# Patient Record
Sex: Male | Born: 1985 | Hispanic: No | Marital: Married | Smoking: Never smoker
Health system: Southern US, Community
[De-identification: ages and names within clinical notes are randomized; demographics above are authoritative.]

## PROBLEM LIST (undated history)

## (undated) DIAGNOSIS — J45909 Unspecified asthma, uncomplicated: Secondary | ICD-10-CM

## (undated) DIAGNOSIS — E7800 Pure hypercholesterolemia, unspecified: Secondary | ICD-10-CM

## (undated) DIAGNOSIS — E119 Type 2 diabetes mellitus without complications: Secondary | ICD-10-CM

---

## 2015-04-03 ENCOUNTER — Emergency Department
Admission: EM | Admit: 2015-04-03 | Discharge: 2015-04-03 | Disposition: A | Discharge status: Home / Self Care | Payer: Self-pay | Attending: Emergency Medicine | Admitting: Emergency Medicine

## 2015-04-03 ENCOUNTER — Emergency Department: Payer: Self-pay

## 2015-04-03 DIAGNOSIS — K760 Fatty (change of) liver, not elsewhere classified: Secondary | ICD-10-CM | POA: Insufficient documentation

## 2015-04-03 DIAGNOSIS — N23 Unspecified renal colic: Secondary | ICD-10-CM | POA: Insufficient documentation

## 2015-04-03 DIAGNOSIS — R739 Hyperglycemia, unspecified: Secondary | ICD-10-CM | POA: Insufficient documentation

## 2015-04-03 LAB — CBC WITH DIFFERENTIAL/PLATELET
BASOS ABS: 0 10*3/uL (ref 0–0.1)
BASOS PCT: 1 %
Eosinophils Absolute: 0.3 10*3/uL (ref 0–0.7)
Eosinophils Relative: 3 %
HEMATOCRIT: 42 % (ref 40.0–52.0)
HEMOGLOBIN: 14.3 g/dL (ref 13.0–18.0)
Lymphocytes Relative: 33 %
Lymphs Abs: 3 10*3/uL (ref 1.0–3.6)
MCH: 27 pg (ref 26.0–34.0)
MCHC: 34.1 g/dL (ref 32.0–36.0)
MCV: 79.1 fL — ABNORMAL LOW (ref 80.0–100.0)
Monocytes Absolute: 0.6 10*3/uL (ref 0.2–1.0)
Monocytes Relative: 7 %
NEUTROS ABS: 5.2 10*3/uL (ref 1.4–6.5)
NEUTROS PCT: 56 %
Platelets: 173 10*3/uL (ref 150–440)
RBC: 5.31 MIL/uL (ref 4.40–5.90)
RDW: 13.3 % (ref 11.5–14.5)
WBC: 9.1 10*3/uL (ref 3.8–10.6)

## 2015-04-03 LAB — BASIC METABOLIC PANEL
ANION GAP: 9 (ref 5–15)
BUN: 13 mg/dL (ref 6–20)
CALCIUM: 8.7 mg/dL — AB (ref 8.9–10.3)
CO2: 25 mmol/L (ref 22–32)
Chloride: 97 mmol/L — ABNORMAL LOW (ref 101–111)
Creatinine, Ser: 0.96 mg/dL (ref 0.61–1.24)
Glucose, Bld: 265 mg/dL — ABNORMAL HIGH (ref 65–99)
Potassium: 3.6 mmol/L (ref 3.5–5.1)
Sodium: 131 mmol/L — ABNORMAL LOW (ref 135–145)

## 2015-04-03 MED ORDER — HYDROMORPHONE HCL 1 MG/ML IJ SOLN
INTRAMUSCULAR | Status: AC
  Administered 2015-04-03: 1 mg via INTRAVENOUS
  Filled 2015-04-03: qty 1

## 2015-04-03 MED ORDER — KETOROLAC TROMETHAMINE 30 MG/ML IJ SOLN
INTRAMUSCULAR | Status: AC
  Administered 2015-04-03: 30 mg via INTRAMUSCULAR
  Filled 2015-04-03: qty 1

## 2015-04-03 MED ORDER — SODIUM CHLORIDE 0.9 % IV BOLUS (SEPSIS)
1000.0000 mL | Freq: Once | INTRAVENOUS | Status: AC
  Administered 2015-04-03: 1000 mL via INTRAVENOUS

## 2015-04-03 MED ORDER — ONDANSETRON HCL 4 MG/2ML IJ SOLN
INTRAMUSCULAR | Status: AC
  Filled 2015-04-03: qty 2

## 2015-04-03 MED ORDER — ONDANSETRON 8 MG PO TBDP: 8.0000 mg | ORAL_TABLET | Freq: Three times a day (TID) | ORAL | Status: AC | PRN

## 2015-04-03 MED ORDER — HYDROMORPHONE HCL 1 MG/ML IJ SOLN
INTRAMUSCULAR | Status: AC
  Filled 2015-04-03: qty 1

## 2015-04-03 MED ORDER — KETOROLAC TROMETHAMINE 30 MG/ML IJ SOLN
30.0000 mg | Freq: Once | INTRAMUSCULAR | Status: AC
  Administered 2015-04-03: 30 mg via INTRAMUSCULAR

## 2015-04-03 MED ORDER — ONDANSETRON HCL 4 MG/2ML IJ SOLN
4.0000 mg | Freq: Once | INTRAMUSCULAR | Status: AC
  Administered 2015-04-03: 4 mg via INTRAVENOUS

## 2015-04-03 MED ORDER — HYDROMORPHONE HCL 1 MG/ML IJ SOLN
1.0000 mg | Freq: Once | INTRAMUSCULAR | Status: AC
  Administered 2015-04-03: 1 mg via INTRAVENOUS

## 2015-04-03 MED ORDER — OXYCODONE-ACETAMINOPHEN 5-325 MG PO TABS: 1.0000 | ORAL_TABLET | Freq: Four times a day (QID) | ORAL | Status: DC | PRN

## 2015-04-03 MED ORDER — NAPROXEN 500 MG PO TABS: 500.0000 mg | ORAL_TABLET | Freq: Two times a day (BID) | ORAL | Status: AC

## 2015-04-03 NOTE — ED Notes (Signed)
Pt being discharged, MD made aware of patient elevated  blood pressure, states okay for patient to still be discharged.

## 2015-04-03 NOTE — Discharge Instructions (Signed)
You were prescribed a medication that is potentially sedating. Do not drink alcohol, drive or participate in any other potentially dangerous activities while taking this medication as it may make you sleepy. Do not take this medication with any other sedating medications, either prescription or over-the-counter. If you were prescribed Percocet or Vicodin, do not take these with acetaminophen (Tylenol) as it is already contained within these medications.   Opioid pain medications (or "narcotics") can be habit forming.  Use it as little as possible to achieve adequate pain control.  Do not use or use it with extreme caution if you have a history of opiate abuse or dependence.  If you are on a pain contract with your primary care doctor or a pain specialist, be sure to let them know you were prescribed this medication today from the [facility].  This medication is intended for your use only - do not give any to anyone else and keep it in a secure place where nobody else, especially children and pets, have access to it.  It will also cause or worsen constipation, so you may want to consider taking an over-the-counter stool softener while you are taking this medication.  Fatty Liver Fatty liver, also called hepatic steatosis or steatohepatitis, is a condition in which too much fat has built up in your liver cells. The liver removes harmful substances from your bloodstream. It produces fluids your body needs. It also helps your body use and store energy from the food you eat. In many cases, fatty liver does not cause symptoms or problems. It is often diagnosed when tests are being done for other reasons. However, over time, fatty liver can cause inflammation that may lead to more serious liver problems, such as scarring of the liver (cirrhosis). CAUSES  Causes of fatty liver may include:   Drinking too much alcohol.  Poor nutrition.  Obesity.  Cushing  syndrome.  Diabetes.  Hyperlipidemia.  Pregnancy.  Certain drugs.  Poisons.  Some viral infections. RISK FACTORS You may be more likely to develop fatty liver if you:  Abuse alcohol.  Are pregnant.  Are overweight.  Have diabetes.  Have hepatitis.  Have a high triglyceride level.  SIGNS AND SYMPTOMS  Fatty liver often does not cause any symptoms. In cases where symptoms develop, they can include:  Fatigue.  Weakness.  Weight loss.  Confusion.   Abdominal pain.  Yellowing of your skin and the white parts of your eyes (jaundice).  Nausea and vomiting. DIAGNOSIS  Fatty liver may be diagnosed by:   Physical exam and medical history.  Blood tests.  Imaging tests, such as an ultrasound, CT scan, or MRI.  Liver biopsy. A small sample of liver tissue is removed using a needle. The sample is then looked at under a microscope. TREATMENT  Fatty liver is often caused by other health conditions. Treatment for fatty liver may involve medicines and lifestyle changes to manage conditions such as:   Alcoholism.  High cholesterol.  Diabetes.  Being overweight or obese.  HOME CARE INSTRUCTIONS  Eat a healthy diet as directed by your health care provider.  Exercise regularly. This can help you lose weight and control your cholesterol and diabetes. Talk to your health care provider about an exercise plan and which activities are best for you.  Do not drink alcohol.   Take medicines only as directed by your health care provider. SEEK MEDICAL CARE IF: You have difficulty controlling your:  Blood sugar.  Cholesterol.  Alcohol consumption. SEEK IMMEDIATE MEDICAL CARE IF:  You have abdominal pain.  You have jaundice.  You have nausea and vomiting.   This information is not intended to replace advice given to you by your health care provider. Make sure you discuss any questions you have with your health care provider.   Document Released: 07/21/2005  Document Revised: 06/26/2014 Document Reviewed: 10/15/2013 Elsevier Interactive Patient Education 2016 [employer]  Hyperglycemia Hyperglycemia occurs when the glucose (sugar) in your blood is too high. Hyperglycemia can happen for many reasons, but it most often happens to people who do not know they have diabetes or are not managing their diabetes properly.  CAUSES  Whether you have diabetes or not, there are other causes of hyperglycemia. Hyperglycemia can occur when you have diabetes, but it can also occur in other situations that you might not be as aware of, such as: Diabetes  If you have diabetes and are having problems controlling your blood glucose, hyperglycemia could occur because of some of the following reasons:  Not following your meal plan.  Not taking your diabetes medications or not taking it properly.  Exercising less or doing less activity than you normally do.  Being sick. Pre-diabetes  This cannot be ignored. Before people develop Type 2 diabetes, they almost always have "pre-diabetes." This is when your blood glucose levels are higher than normal, but not yet high enough to be diagnosed as diabetes. Research has shown that some long-term damage to the body, especially the heart and circulatory system, may already be occurring during pre-diabetes. If you take action to manage your blood glucose when you have pre-diabetes, you may delay or prevent Type 2 diabetes from developing. Stress  If you have diabetes, you may be "diet" controlled or on oral medications or insulin to control your diabetes. However, you may find that your blood glucose is higher than usual in the hospital whether you have diabetes or not. This is often referred to as "stress hyperglycemia." Stress can elevate your blood glucose. This happens because of hormones put out by the body during times of stress. If stress has been the cause of your high blood glucose, it can be followed regularly by your  caregiver. That way he/she can make sure your hyperglycemia does not continue to get worse or progress to diabetes. Steroids  Steroids are medications that act on the infection fighting system (immune system) to block inflammation or infection. One side effect can be a rise in blood glucose. Most people can produce enough extra insulin to allow for this rise, but for those who cannot, steroids make blood glucose levels go even higher. It is not unusual for steroid treatments to "uncover" diabetes that is developing. It is not always possible to determine if the hyperglycemia will go away after the steroids are stopped. A special blood test called an A1c is sometimes done to determine if your blood glucose was elevated before the steroids were started. SYMPTOMS  Thirsty.  Frequent urination.  Dry mouth.  Blurred vision.  Tired or fatigue.  Weakness.  Sleepy.  Tingling in feet or leg. DIAGNOSIS  Diagnosis is made by monitoring blood glucose in one or all of the following ways:  A1c test. This is a chemical found in your blood.  Fingerstick blood glucose monitoring.  Laboratory results. TREATMENT  First, knowing the cause of the hyperglycemia is important before the hyperglycemia can be treated. Treatment may include, but is not be limited to:  Education.  Change or  adjustment in medications.  Change or adjustment in meal plan.  Treatment for an illness, infection, etc.  More frequent blood glucose monitoring.  Change in exercise plan.  Decreasing or stopping steroids.  Lifestyle changes. HOME CARE INSTRUCTIONS   Test your blood glucose as directed.  Exercise regularly. Your caregiver will give you instructions about exercise. Pre-diabetes or diabetes which comes on with stress is helped by exercising.  Eat wholesome, balanced meals. Eat often and at regular, fixed times. Your caregiver or nutritionist will give you a meal plan to guide your sugar intake.  Being at  an ideal weight is important. If needed, losing as little as 10 to 15 pounds may help improve blood glucose levels. SEEK MEDICAL CARE IF:   You have questions about medicine, activity, or diet.  You continue to have symptoms (problems such as increased thirst, urination, or weight gain). SEEK IMMEDIATE MEDICAL CARE IF:   You are vomiting or have diarrhea.  Your breath smells fruity.  You are breathing faster or slower.  You are very sleepy or incoherent.  You have numbness, tingling, or pain in your feet or hands.  You have chest pain.  Your symptoms get worse even though you have been following your caregiver's orders.  If you have any other questions or concerns.   This information is not intended to replace advice given to you by your health care provider. Make sure you discuss any questions you have with your health care provider.   Document Released: 11/29/2000 Document Revised: 08/28/2011 Document Reviewed: 02/09/2015 Elsevier Interactive Patient Education 2016 Else[employer] Inc[employer]  Kidney Stones Kidney stones (urolithiasis) are deposits that form inside your kidneys. The intense pain is caused by the stone moving through the urinary tract. When the stone moves, the ureter goes into spasm around the stone. The stone is usually passed in the urine.  CAUSES   A disorder that makes certain neck glands produce too much parathyroid hormone (primary hyperparathyroidism).  A buildup of uric acid crystals, similar to gout in your joints.  Narrowing (stricture) of the ureter.  A kidney obstruction present at birth (congenital obstruction).  Previous surgery on the kidney or ureters.  Numerous kidney infections. SYMPTOMS   Feeling sick to your stomach (nauseous).  Throwing up (vomiting).  Blood in the urine (hematuria).  Pain that usually spreads (radiates) to the groin.  Frequency or urgency of urination. DIAGNOSIS   Taking a history and physical exam.  Blood or  urine tests.  CT scan.  Occasionally, an examination of the inside of the urinary bladder (cystoscopy) is performed. TREATMENT   Observation.  Increasing your fluid intake.  Extracorporeal shock wave lithotripsy--This is a noninvasive procedure that uses shock waves to break up kidney stones.  Surgery may be needed if you have severe pain or persistent obstruction. There are various surgical procedures. Most of the procedures are performed with the use of small instruments. Only small incisions are needed to accommodate these instruments, so recovery time is minimized. The size, location, and chemical composition are all important variables that will determine the proper choice of action for you. Talk to your health care provider to better understand your situation so that you will minimize the risk of injury to yourself and your kidney.  HOME CARE INSTRUCTIONS   Drink enough water and fluids to keep your urine clear or pale yellow. This will help you to pass the stone or stone fragments.  Strain all urine through the provided strainer. Keep all particulate matter  and stones for your health care provider to see. The stone causing the pain may be as small as a grain of salt. It is very important to use the strainer each and every time you pass your urine. The collection of your stone will allow your health care provider to analyze it and verify that a stone has actually passed. The stone analysis will often identify what you can do to reduce the incidence of recurrences.  Only take over-the-counter or prescription medicines for pain, discomfort, or fever as directed by your health care provider.  Keep all follow-up visits as told by your health care provider. This is important.  Get follow-up X-rays if required. The absence of pain does not always mean that the stone has passed. It may have only stopped moving. If the urine remains completely obstructed, it can cause loss of kidney function  or even complete destruction of the kidney. It is your responsibility to make sure X-rays and follow-ups are completed. Ultrasounds of the kidney can show blockages and the status of the kidney. Ultrasounds are not associated with any radiation and can be performed easily in a matter of minutes.  Make changes to your daily diet as told by your health care provider. You may be told to:  Limit the amount of salt that you eat.  Eat 5 or more servings of fruits and vegetables each day.  Limit the amount of meat, poultry, fish, and eggs that you eat.  Collect a 24-hour urine sample as told by your health care provider.You may need to collect another urine sample every 6-12 months. SEEK MEDICAL CARE IF:  You experience pain that is progressive and unresponsive to any pain medicine you have been prescribed. SEEK IMMEDIATE MEDICAL CARE IF:   Pain cannot be controlled with the prescribed medicine.  You have a fever or shaking chills.  The severity or intensity of pain increases over 18 hours and is not relieved by pain medicine.  You develop a new onset of abdominal pain.  You feel faint or pass out.  You are unable to urinate.   This information is not intended to replace advice given to you by your health care provider. Make sure you discuss any questions you have with your health care provider.   Document Released: 06/05/2005 Document Revised: 02/24/2015 Document Reviewed: 11/06/2012 Elsevier Interactive Patient Education [employer]

## 2015-04-03 NOTE — ED Notes (Signed)
Pt here with c/o sudden onset right flank pain,   Pain is constant and increases in intensity at times,  +nausea and vomited x 1 en route.  Denies urinary symptoms,  States he has not voided since this morning,   No hx of kidney stones

## 2015-04-03 NOTE — ED Notes (Signed)
Patient transported to CT 

## 2015-04-03 NOTE — ED Notes (Signed)
Pt requesting pain medication prior to discharge, reports pain is increasing. MD notified, see MAR for order.

## 2015-04-03 NOTE — ED Provider Notes (Signed)
[facility] Emergency Department Provider Note  ____________________________________________  Time seen: 6:45 PM  I have reviewed the triage vital signs and the nursing notes.   HISTORY  Chief Complaint Flank Pain    HPI William Kidd is a 29 y.o. male who complains of severe sudden onset of right flank pain one hour ago. It's constant but waxing and waning. Associated with nausea and vomiting times one. No chest pain shortness breath fevers or chills. Decreased urination today but otherwise no urinary symptoms recently. No medical problems. Does not take any medications no surgeries. Does not smoke or drink.     History reviewed. No pertinent past medical history.   There are no active problems to display for this patient.    History reviewed. No pertinent past surgical history.   Current Outpatient Rx  Name  Route  Sig  Dispense  Refill  . naproxen (NAPROSYN) 500 MG tablet   Oral   Take 1 tablet (500 mg total) by mouth 2 (two) times daily with a meal.   20 tablet   0   . ondansetron (ZOFRAN ODT) 8 MG disintegrating tablet   Oral   Take 1 tablet (8 mg total) by mouth every 8 (eight) hours as needed for nausea or vomiting.   20 tablet   0   . oxyCODONE-acetaminophen (ROXICET) 5-325 MG tablet   Oral   Take 1 tablet by mouth every 6 (six) hours as needed for severe pain.   8 tablet   0      Allergies Review of patient's allergies indicates no known allergies.   No family history on file.  Social History Social History  Substance Use Topics  . Smoking status: Never Smoker   . Smokeless tobacco: None  . Alcohol Use: None    Review of Systems  Constitutional:   No fever or chills. No weight changes Eyes:   No blurry vision or double vision.  ENT:   No sore throat. Cardiovascular:   No chest pain. Respiratory:   No dyspnea or cough. Gastrointestinal:   Negative for abdominal pain, vomiting and diarrhea.  No  BRBPR or melena. Genitourinary:   Negative for dysuria, urinary retention, bloody urine, or difficulty urinating. Musculoskeletal: positive right flank pain No joint swelling or pain. Skin:   Negative for rash. Neurological:   Negative for headaches, focal weakness or numbness. Psychiatric:  No anxiety or depression.   Endocrine:  No hot/cold intolerance, changes in energy, or sleep difficulty.  10-point ROS otherwise negative.  ____________________________________________   PHYSICAL EXAM:  VITAL SIGNS: ED Triage Vitals  Enc Vitals Group     BP 04/03/15 1900 157/109 mmHg     Pulse Rate 04/03/15 1900 69     Resp 04/03/15 1950 26     Temp 04/03/15 1950 98.5 F (36.9 C)     Temp Source 04/03/15 1950 Oral     SpO2 04/03/15 1900 99 %     Weight --      Height --      Head Cir --      Peak Flow --      Pain Score 04/03/15 1950 9     Pain Loc --      Pain Edu? --      Excl. in GC? --      Constitutional:   Alert and oriented. Mild distress due to pain. Eyes:   No scleral icterus. No conjunctival pallor. PERRL. EOMI ENT   Head:  Normocephalic and atraumatic.   Nose:   No congestion/rhinnorhea. No septal hematoma   Mouth/Throat:   MMM, no pharyngeal erythema. No peritonsillar mass. No uvula shift.   Neck:   No stridor. No SubQ emphysema. No meningismus. Hematological/Lymphatic/Immunilogical:   No cervical lymphadenopathy. Cardiovascular:   RRR. Normal and symmetric distal pulses are present in all extremities. No murmurs, rubs, or gallops. Respiratory:   Normal respiratory effort without tachypnea nor retractions. Breath sounds are clear and equal bilaterally. No wheezes/rales/rhonchi. Gastrointestinal:   Soft and nontender. No distention. There is no CVA tenderness.  No rebound, rigidity, or guarding. Genitourinary:   deferred Musculoskeletal:   Nontender with normal range of motion in all extremities. No joint effusions.  No lower extremity tenderness.  No  edema. Neurologic:   Normal speech and language.  CN 2-10 normal. Motor grossly intact. No pronator drift.  Normal gait. No gross focal neurologic deficits are appreciated.  Skin:    Skin is warm, dry and intact. No rash noted.  No petechiae, purpura, or bullae. Psychiatric:   Mood and affect are normal. Speech and behavior are normal. Patient exhibits appropriate insight and judgment.  ____________________________________________    LABS (pertinent positives/negatives) (all labs ordered are listed, but only abnormal results are displayed) Labs Reviewed  BASIC METABOLIC PANEL - Abnormal; Notable for the following:    Sodium 131 (*)    Chloride 97 (*)    Glucose, Bld 265 (*)    Calcium 8.7 (*)    All other components within normal limits  CBC WITH DIFFERENTIAL/PLATELET - Abnormal; Notable for the following:    MCV 79.1 (*)    All other components within normal limits   ____________________________________________   EKG    ____________________________________________    RADIOLOGY  CT abdomen and pelvis reveals 1 mm ureterolithiasis at the right UVJ without obstruction  ____________________________________________   PROCEDURES   ____________________________________________   INITIAL IMPRESSION / ASSESSMENT AND PLAN / ED COURSE  Pertinent labs & imaging results that were available during my care of the patient were reviewed by me and considered in my medical decision making (see chart for details).  Patient presents with renal colic. CT finds a 1 mm stone that is already migrated to the UVJ. Given the very short duration of the symptoms and the tiny stone that is already at the UVJ, without evidence of obstruction, there is almost no chance that he could develop an infection or complication from this. We'll have him follow up with primary care. I counseled the patient on his high blood sugar as well as the hepatic steatosis of that he can follow up for further  evaluation and management of any chronic conditions. We have the patient NSAIDs Percocet and Zofran for symptomatic relief until the stone passes.     ____________________________________________   FINAL CLINICAL IMPRESSION(S) / ED DIAGNOSES  Final diagnoses:  Renal colic on right side  Hyperglycemia  Hepatic steatosis      Sharman Cheek, MD 04/03/15 2046

## 2016-01-01 ENCOUNTER — Encounter: Payer: Self-pay | Admitting: Emergency Medicine

## 2016-01-01 ENCOUNTER — Other Ambulatory Visit: Payer: Self-pay

## 2016-01-01 ENCOUNTER — Emergency Department: Payer: BLUE CROSS/BLUE SHIELD

## 2016-01-01 DIAGNOSIS — E119 Type 2 diabetes mellitus without complications: Secondary | ICD-10-CM | POA: Diagnosis not present

## 2016-01-01 DIAGNOSIS — F410 Panic disorder [episodic paroxysmal anxiety] without agoraphobia: Secondary | ICD-10-CM | POA: Diagnosis not present

## 2016-01-01 DIAGNOSIS — J45909 Unspecified asthma, uncomplicated: Secondary | ICD-10-CM | POA: Diagnosis not present

## 2016-01-01 DIAGNOSIS — R0600 Dyspnea, unspecified: Secondary | ICD-10-CM | POA: Diagnosis present

## 2016-01-01 DIAGNOSIS — Z79899 Other long term (current) drug therapy: Secondary | ICD-10-CM | POA: Diagnosis not present

## 2016-01-01 DIAGNOSIS — Z7984 Long term (current) use of oral hypoglycemic drugs: Secondary | ICD-10-CM | POA: Insufficient documentation

## 2016-01-01 LAB — CBC
HCT: 42.1 % (ref 40.0–52.0)
Hemoglobin: 14.5 g/dL (ref 13.0–18.0)
MCH: 27.6 pg (ref 26.0–34.0)
MCHC: 34.5 g/dL (ref 32.0–36.0)
MCV: 79.9 fL — ABNORMAL LOW (ref 80.0–100.0)
PLATELETS: 191 10*3/uL (ref 150–440)
RBC: 5.26 MIL/uL (ref 4.40–5.90)
RDW: 13.4 % (ref 11.5–14.5)
WBC: 7.9 10*3/uL (ref 3.8–10.6)

## 2016-01-01 NOTE — ED Notes (Addendum)
Pt c/o intermittent pain to his upper back for about a year; now feels like he's having trouble breathing and short of breath; history of asthma but not using at this time; pt concerned he's having a heart attack; denies chest pain; pt talking in complete coherent sentences but occasionally, when not talking, takes in a large deep breath

## 2016-01-02 ENCOUNTER — Emergency Department
Admission: EM | Admit: 2016-01-02 | Discharge: 2016-01-02 | Disposition: A | Discharge status: Home / Self Care | Payer: BLUE CROSS/BLUE SHIELD | Attending: Emergency Medicine | Admitting: Emergency Medicine

## 2016-01-02 DIAGNOSIS — F410 Panic disorder [episodic paroxysmal anxiety] without agoraphobia: Secondary | ICD-10-CM

## 2016-01-02 HISTORY — DX: Type 2 diabetes mellitus without complications: E11.9

## 2016-01-02 HISTORY — DX: Unspecified asthma, uncomplicated: J45.909

## 2016-01-02 HISTORY — DX: Pure hypercholesterolemia, unspecified: E78.00

## 2016-01-02 LAB — FIBRIN DERIVATIVES D-DIMER (ARMC ONLY): Fibrin derivatives D-dimer (ARMC): 101 (ref 0–499)

## 2016-01-02 LAB — BASIC METABOLIC PANEL
Anion gap: 9 (ref 5–15)
BUN: 15 mg/dL (ref 6–20)
CALCIUM: 9.3 mg/dL (ref 8.9–10.3)
CO2: 27 mmol/L (ref 22–32)
CREATININE: 0.9 mg/dL (ref 0.61–1.24)
Chloride: 99 mmol/L — ABNORMAL LOW (ref 101–111)
GFR calc Af Amer: >60 mL/min (ref >60)
GLUCOSE: 154 mg/dL — AB (ref 65–99)
POTASSIUM: 3.5 mmol/L (ref 3.5–5.1)
Sodium: 135 mmol/L (ref 135–145)

## 2016-01-02 LAB — TROPONIN I
Troponin I: <0.03 ng/mL (ref <0.03)
Troponin I: <0.03 ng/mL (ref <0.03)

## 2016-01-02 MED ORDER — LORAZEPAM 1 MG PO TABS
1.0000 mg | ORAL_TABLET | Freq: Once | ORAL | Status: AC
  Administered 2016-01-02: 1 mg via ORAL
  Filled 2016-01-02: qty 1

## 2016-01-02 NOTE — Discharge Instructions (Signed)

## 2016-01-02 NOTE — ED Provider Notes (Signed)
[facility] Emergency Department Provider Note  ____________________________________________  Time seen: 3:30 AM  I have reviewed the triage vital signs and the nursing notes.   HISTORY  Chief Complaint Shortness of Breath and Back Pain      HPI [name] is a 30 y.o. male resents with history of intermittent episodes of dyspnea 5 years. Patient states that he gets "worked up and was breath gets worse". Patient states that he's been seen for this in the past and a left this was secondary to a panic attack for which she received a medication that helped "tremendously". Patient denies any chest pain no nausea no vomiting no diaphoresis. Patient sibling at bedside states that the patient Google his symptoms and then becomes very anxious. Patient denies any leg pain swelling. Patient denies any history of DVT or PE.    Past Medical History  Diagnosis Date  . Diabetes mellitus without complication (HCC)   . Asthma   . High cholesterol     There are no active problems to display for this patient.   History reviewed. No pertinent past surgical history.  Current Outpatient Rx  Name  Route  Sig  Dispense  Refill  . fenofibrate 54 MG tablet   Oral   Take 54 mg by mouth daily.         Marland Kitchen lisinopril (PRINIVIL,ZESTRIL) 5 MG tablet   Oral   Take 5 mg by mouth daily.         . metFORMIN (GLUCOPHAGE) 500 MG tablet   Oral   Take by mouth 2 (two) times daily with a meal.         . naproxen (NAPROSYN) 500 MG tablet   Oral   Take 1 tablet (500 mg total) by mouth 2 (two) times daily with a meal.   20 tablet   0   . ondansetron (ZOFRAN ODT) 8 MG disintegrating tablet   Oral   Take 1 tablet (8 mg total) by mouth every 8 (eight) hours as needed for nausea or vomiting.   20 tablet   0   . oxyCODONE-acetaminophen (ROXICET) 5-325 MG tablet   Oral   Take 1 tablet by mouth every 6 (six) hours as needed for severe pain.   8 tablet   0      Allergies No known drug allergies History reviewed. No pertinent family history.  Social History Social History  Substance Use Topics  . Smoking status: Never Smoker   . Smokeless tobacco: None  . Alcohol Use: No    Review of Systems  Constitutional: Negative for fever. Eyes: Negative for visual changes. ENT: Negative for sore throat. Cardiovascular: Negative for chest pain. Respiratory: Negative for shortness of breath. Gastrointestinal: Negative for abdominal pain, vomiting and diarrhea. Genitourinary: Negative for dysuria. Musculoskeletal: Negative for back pain. Skin: Negative for rash. Neurological: Negative for headaches, focal weakness or numbness. Psychiatric:Positive for anxiety  10-point ROS otherwise negative.  ____________________________________________   PHYSICAL EXAM:  VITAL SIGNS: ED Triage Vitals  Enc Vitals Group     BP 01/01/16 2322 140/73 mmHg     Pulse Rate 01/01/16 2322 88     Resp 01/01/16 2322 18     Temp 01/01/16 2322 98.7 F (37.1 C)     Temp Source 01/01/16 2322 Oral     SpO2 01/01/16 2322 100 %     Weight 01/01/16 2322 179 lb (81.194 kg)     Height --      Head  Cir --      Peak Flow --      Pain Score 01/01/16 2323 4     Pain Loc --      Pain Edu? --      Excl. in GC? --      Constitutional: Alert and oriented. Appears anxious Eyes: Conjunctivae are normal. PERRL. Normal extraocular movements. ENT   Head: Normocephalic and atraumatic.   Nose: No congestion/rhinnorhea.   Mouth/Throat: Mucous membranes are moist.   Neck: No stridor. Hematological/Lymphatic/Immunilogical: No cervical lymphadenopathy. Cardiovascular: Normal rate, regular rhythm. Normal and symmetric distal pulses are present in all extremities. No murmurs, rubs, or gallops. Respiratory: Normal respiratory effort without tachypnea nor retractions. Breath sounds are clear and equal bilaterally. No wheezes/rales/rhonchi. Gastrointestinal: Soft and  nontender. No distention. There is no CVA tenderness. Genitourinary: deferred Musculoskeletal: Nontender with normal range of motion in all extremities. No joint effusions.  No lower extremity tenderness nor edema. Neurologic:  Normal speech and language. No gross focal neurologic deficits are appreciated. Speech is normal.  Skin:  Skin is warm, dry and intact. No rash noted. Psychiatric: Anxious affect patient shaking his legs continuously during  evaluation. Speech and behavior are normal. Patient exhibits appropriate insight and judgment.  ____________________________________________    LABS (pertinent positives/negatives)  Labs Reviewed  BASIC METABOLIC PANEL - Abnormal; Notable for the following:    Chloride 99 (*)    Glucose, Bld 154 (*)    All other components within normal limits  CBC - Abnormal; Notable for the following:    MCV 79.9 (*)    All other components within normal limits  TROPONIN I  TROPONIN I  FIBRIN DERIVATIVES D-DIMER ([facility] ONLY)     ____________________________________________   EKG  ED ECG REPORT I, [place] N Cheralyn Oliver, the attending physician, personally viewed and interpreted this ECG.   Date: 01/02/2016  EKG Time: 11:23 PM  Rate: 75  Rhythm: Normal sinus rhythm  Axis: Normal  Intervals: Normal  ST&T Change: None  ____________________________________________    RADIOLOGY      DG Chest 2 View (Final result) Result time: 01/01/16 23:53:36   Final result by Rad Results In Interface (01/01/16 23:53:36)   Narrative:   CLINICAL DATA: 30 year old male with shortness of breath  EXAM: CHEST 2 VIEW  COMPARISON: None.  FINDINGS: The heart size and mediastinal contours are within normal limits. Both lungs are clear. The visualized skeletal structures are unremarkable.  IMPRESSION: No active cardiopulmonary disease.   Electronically Signed By: Elgie CollardArash Radparvar M.D. On: 01/01/2016 23:53          Procedures      INITIAL IMPRESSION / ASSESSMENT AND PLAN / ED COURSE  Pertinent labs & imaging results that were available during my care of the patient were reviewed by me and considered in my medical decision making (see chart for details).  EKG cardiac enzymes negative d-dimer negative. Patient clearly very anxious  ____________________________________________   FINAL CLINICAL IMPRESSION(S) / ED DIAGNOSES  Final diagnoses:  Panic attack      Darci Currentandolph N Carlinda Ohlson, MD 01/02/16 [id number]

## 2016-08-23 IMAGING — CT CT RENAL STONE PROTOCOL
3 of 4 series · 10 of 46 positions shown, 17 images · non-contrast
Comparison: None.

CLINICAL DATA: Right flank pain, vomiting 4 hr

EXAM:
CT ABDOMEN AND PELVIS WITHOUT CONTRAST
TECHNIQUE: Multidetector CT imaging of the abdomen and pelvis was performed
following the standard protocol without IV contrast.

[Series 5: lung · axial · 0.76mm/px · z∈[-100,+20]mm · 6 of 34 slices shown, 11 images]
[im 5/34  soft-tissue]
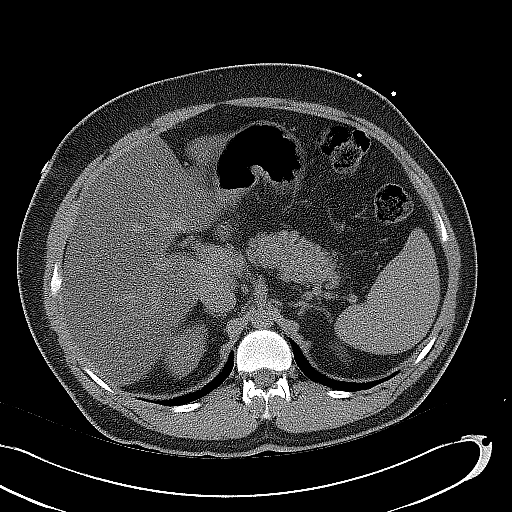
[im 5/34  bone]
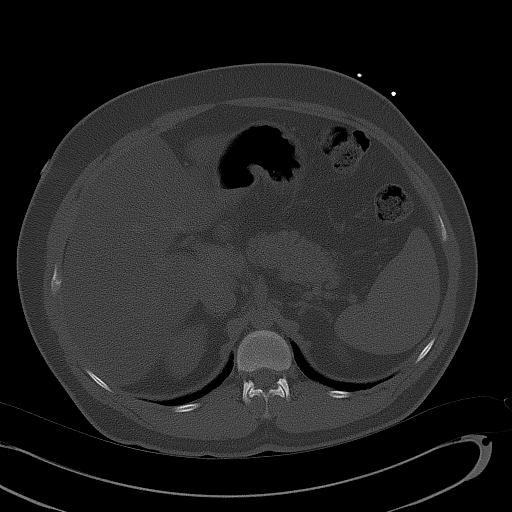
[im 10/34  soft-tissue]
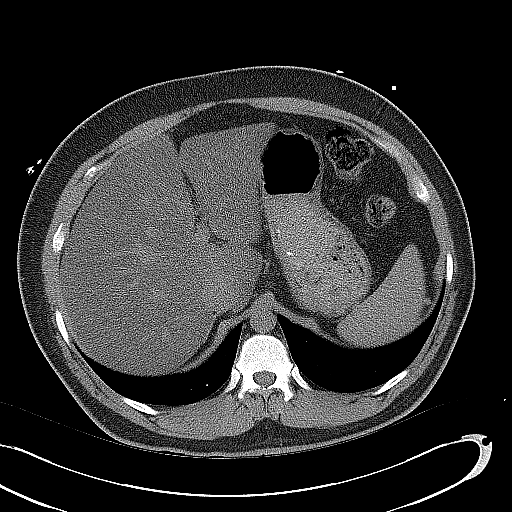
[im 15/34  soft-tissue]
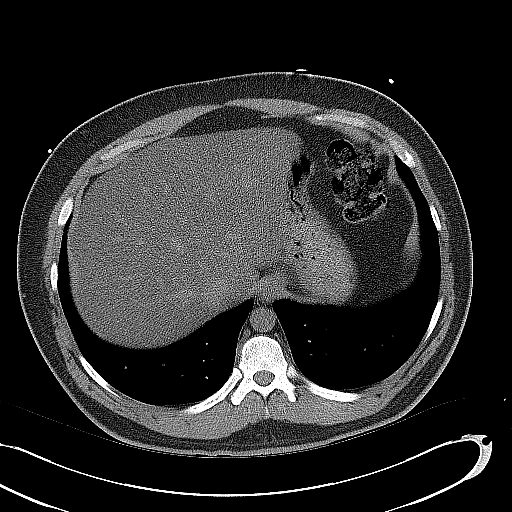
[im 15/34  lung]
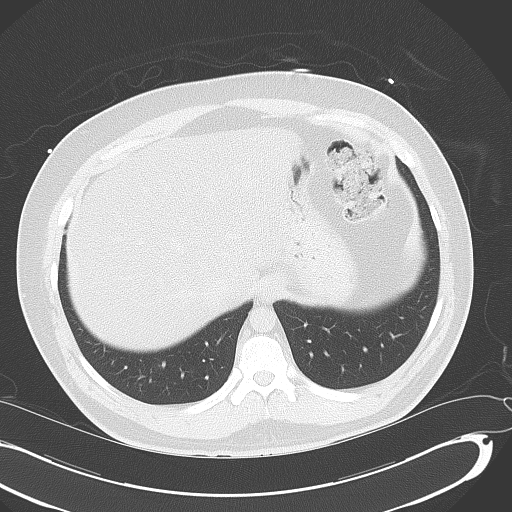
[im 19/34  soft-tissue]
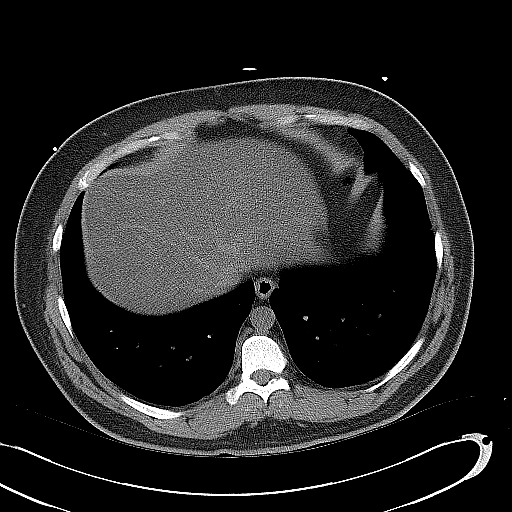
[im 19/34  lung]
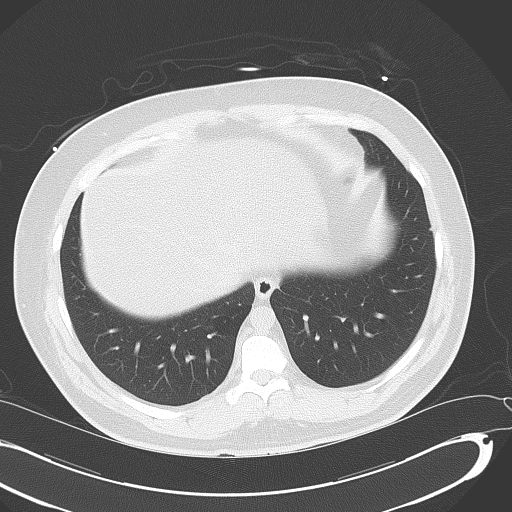
[im 24/34  soft-tissue]
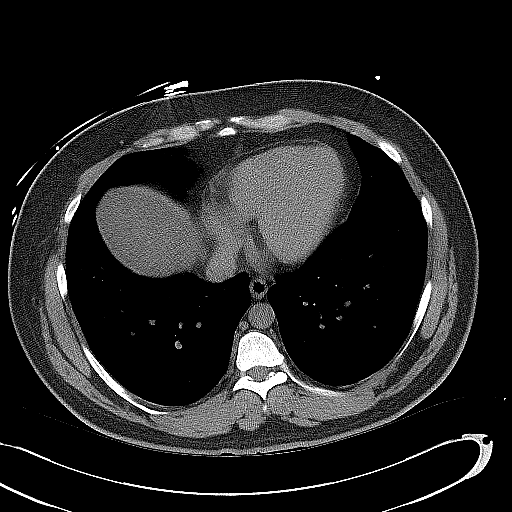
[im 24/34  lung]
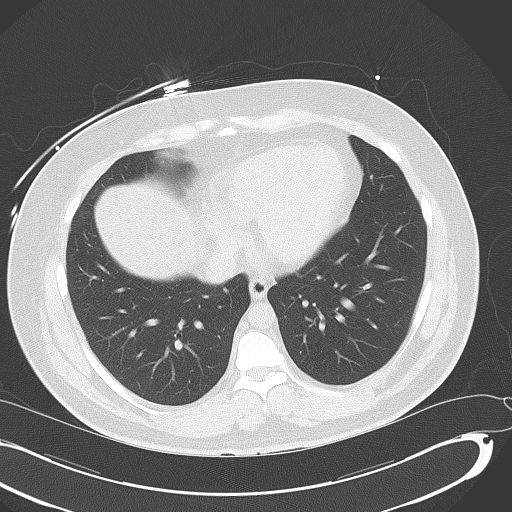
[im 29/34  soft-tissue]
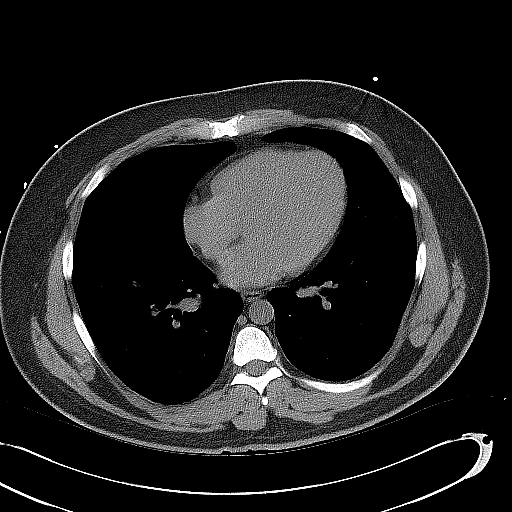
[im 29/34  lung]
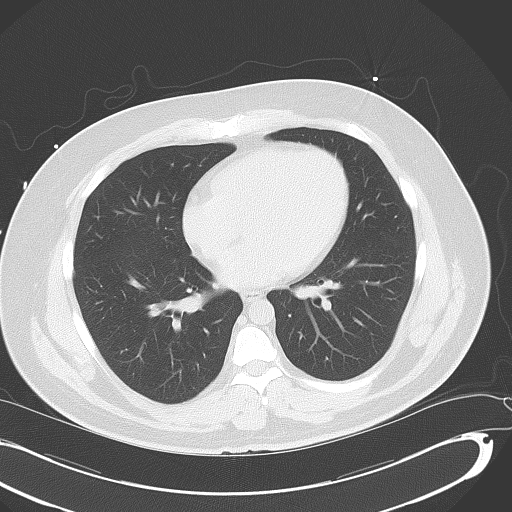

[Series 6: coronal · coronal · 0.74mm/px · 3 of 145 slices shown, 4 images]
[im 49/145  soft-tissue]
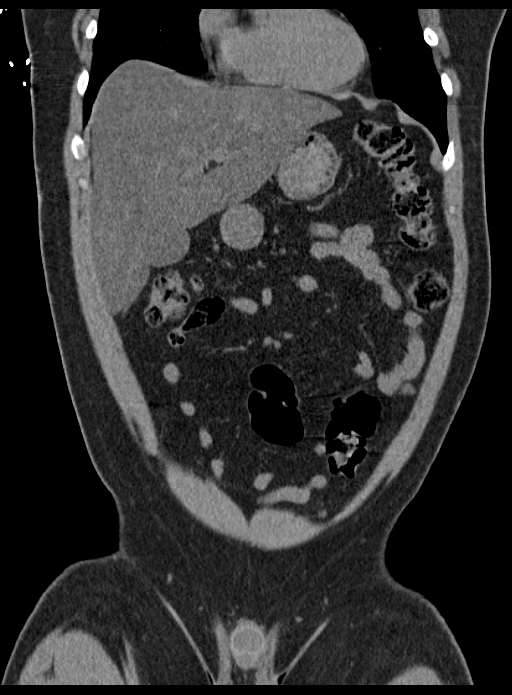
[im 65/145  soft-tissue]
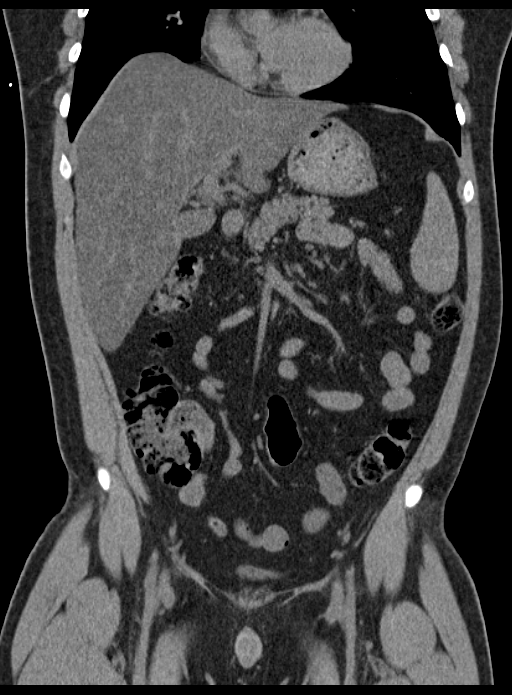
[im 65/145  bone]
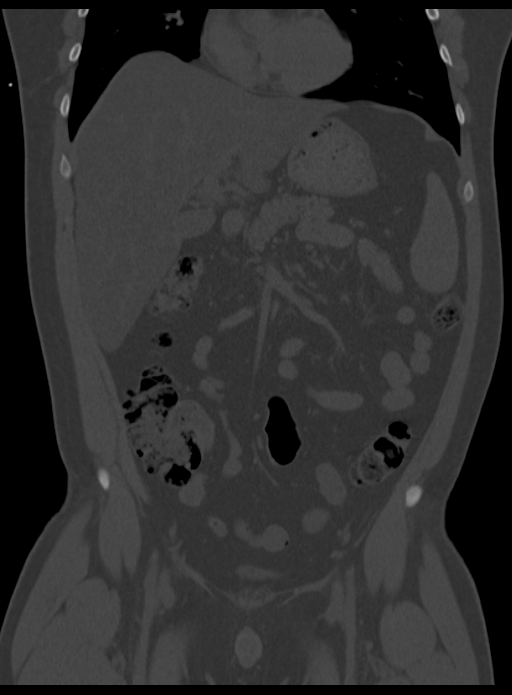
[im 81/145  soft-tissue]
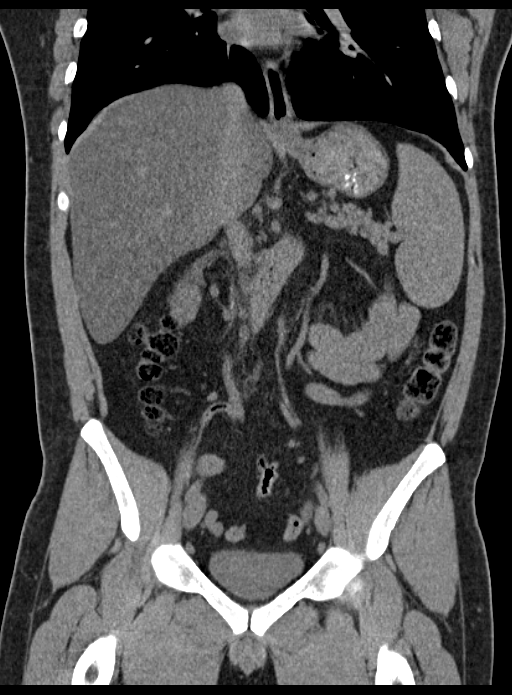

[Series 7: sagittal · sagittal · 0.63mm/px · 1 of 179 slices shown, 2 images]
[im 60/179  soft-tissue]
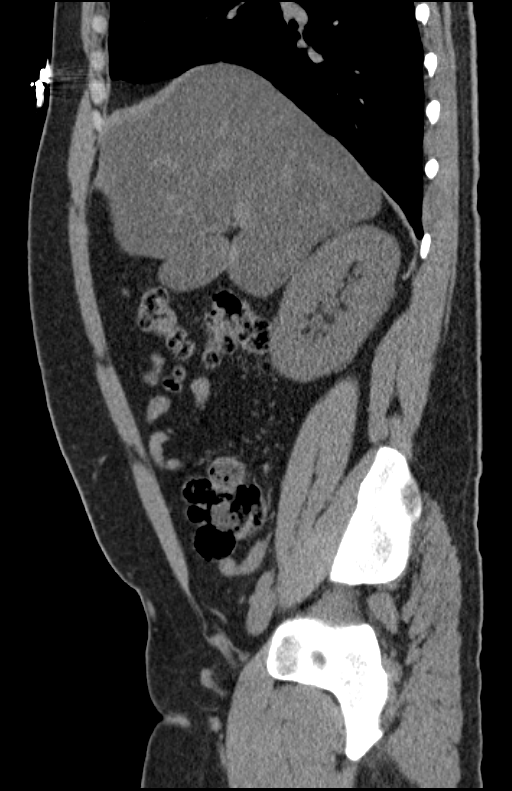
[im 60/179  bone]
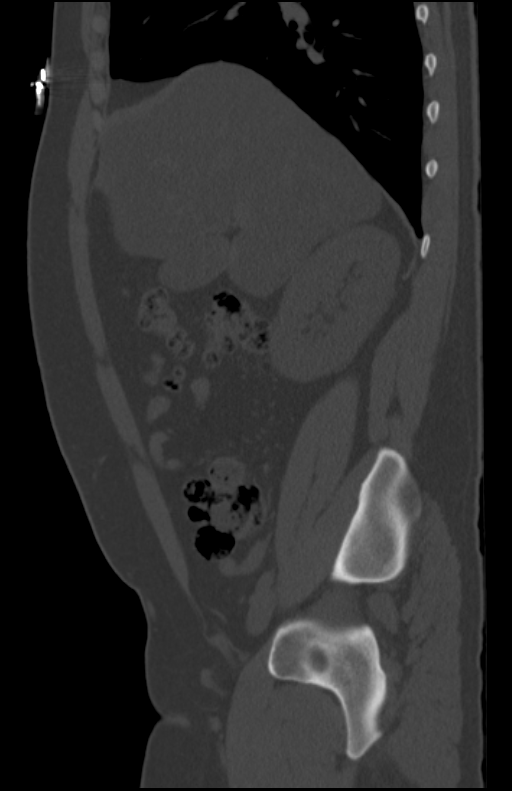

[10 of 46 positions shown; findings below may reference images not displayed]

FINDINGS: Lower chest:  Clear lung bases.  Normal heart size.

Hepatobiliary: Diffuse low attenuation of the liver consistent with
hepatic steatosis. Normal gallbladder.

Pancreas: Normal.

Spleen: Normal.

Adrenals/Urinary Tract: Normal adrenal glands. Normal left kidney.
Normal right kidney. Mild right perinephric cyst scratch them mild
right perinephric stranding. 1 mm right UVJ calculus. Minimal right
hydronephrosis. Otherwise unremarkable bladder.

Stomach/Bowel: No bowel dilatation or bowel wall thickening. No
pneumatosis, pneumoperitoneum or portal venous gas. Normal appendix.
No abdominal or pelvic free fluid.

Vascular/Lymphatic: Normal caliber abdominal aorta. No abdominal or
pelvic lymphadenopathy.

Other: No fluid collection or hematoma.

Musculoskeletal: No acute osseous abnormality. No aggressive lytic
or sclerotic osseous lesion.
IMPRESSION: 1. There is a 1 mm right UVJ calculus with minimal right
hydronephrosis.
2. Severe hepatic steatosis.

## 2017-05-23 IMAGING — CR DG CHEST 2V
2 series · 2 of 2 positions shown · non-contrast
Comparison: None.

CLINICAL DATA: 29-year-old male with shortness of breath

EXAM:
CHEST  2 VIEW

[chest pa]
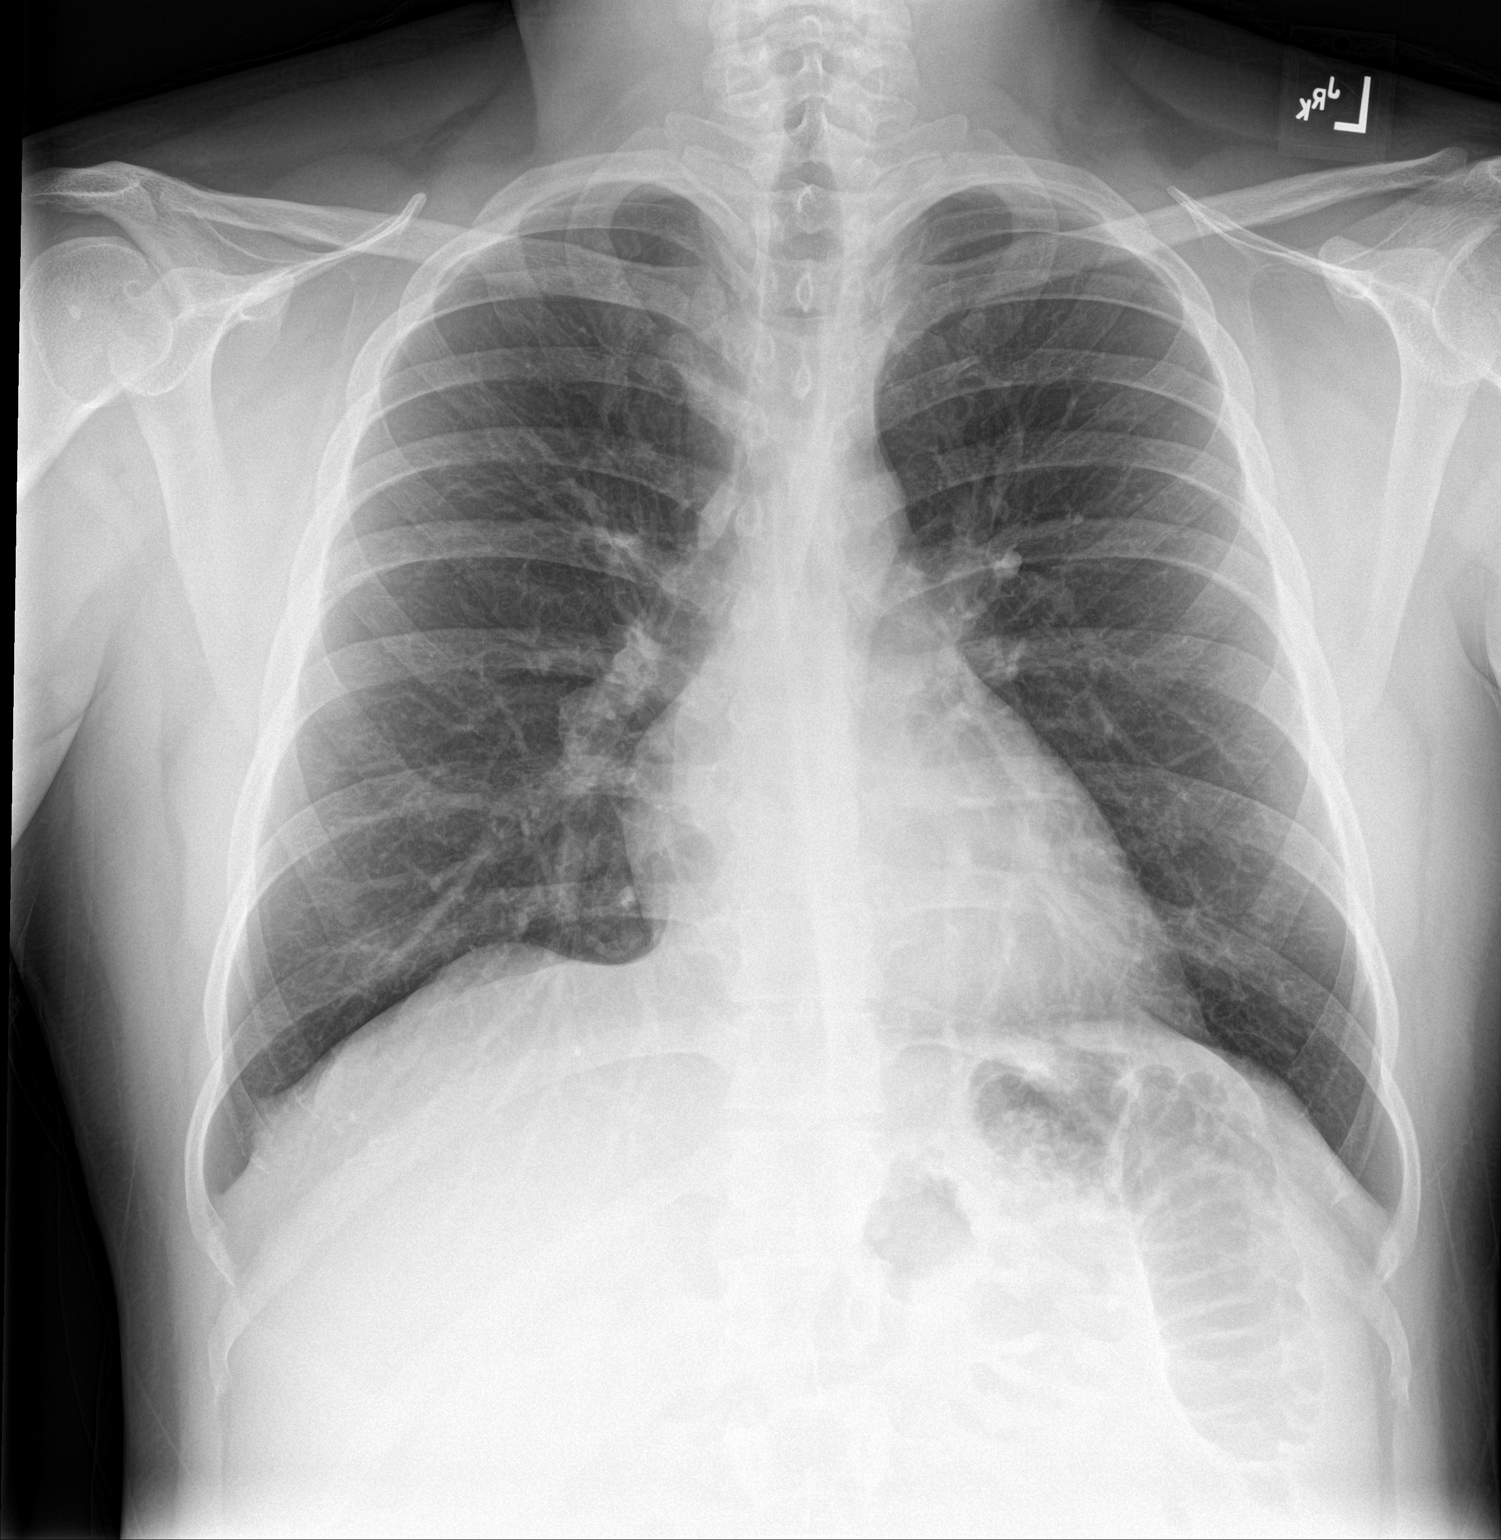

[chest lat]
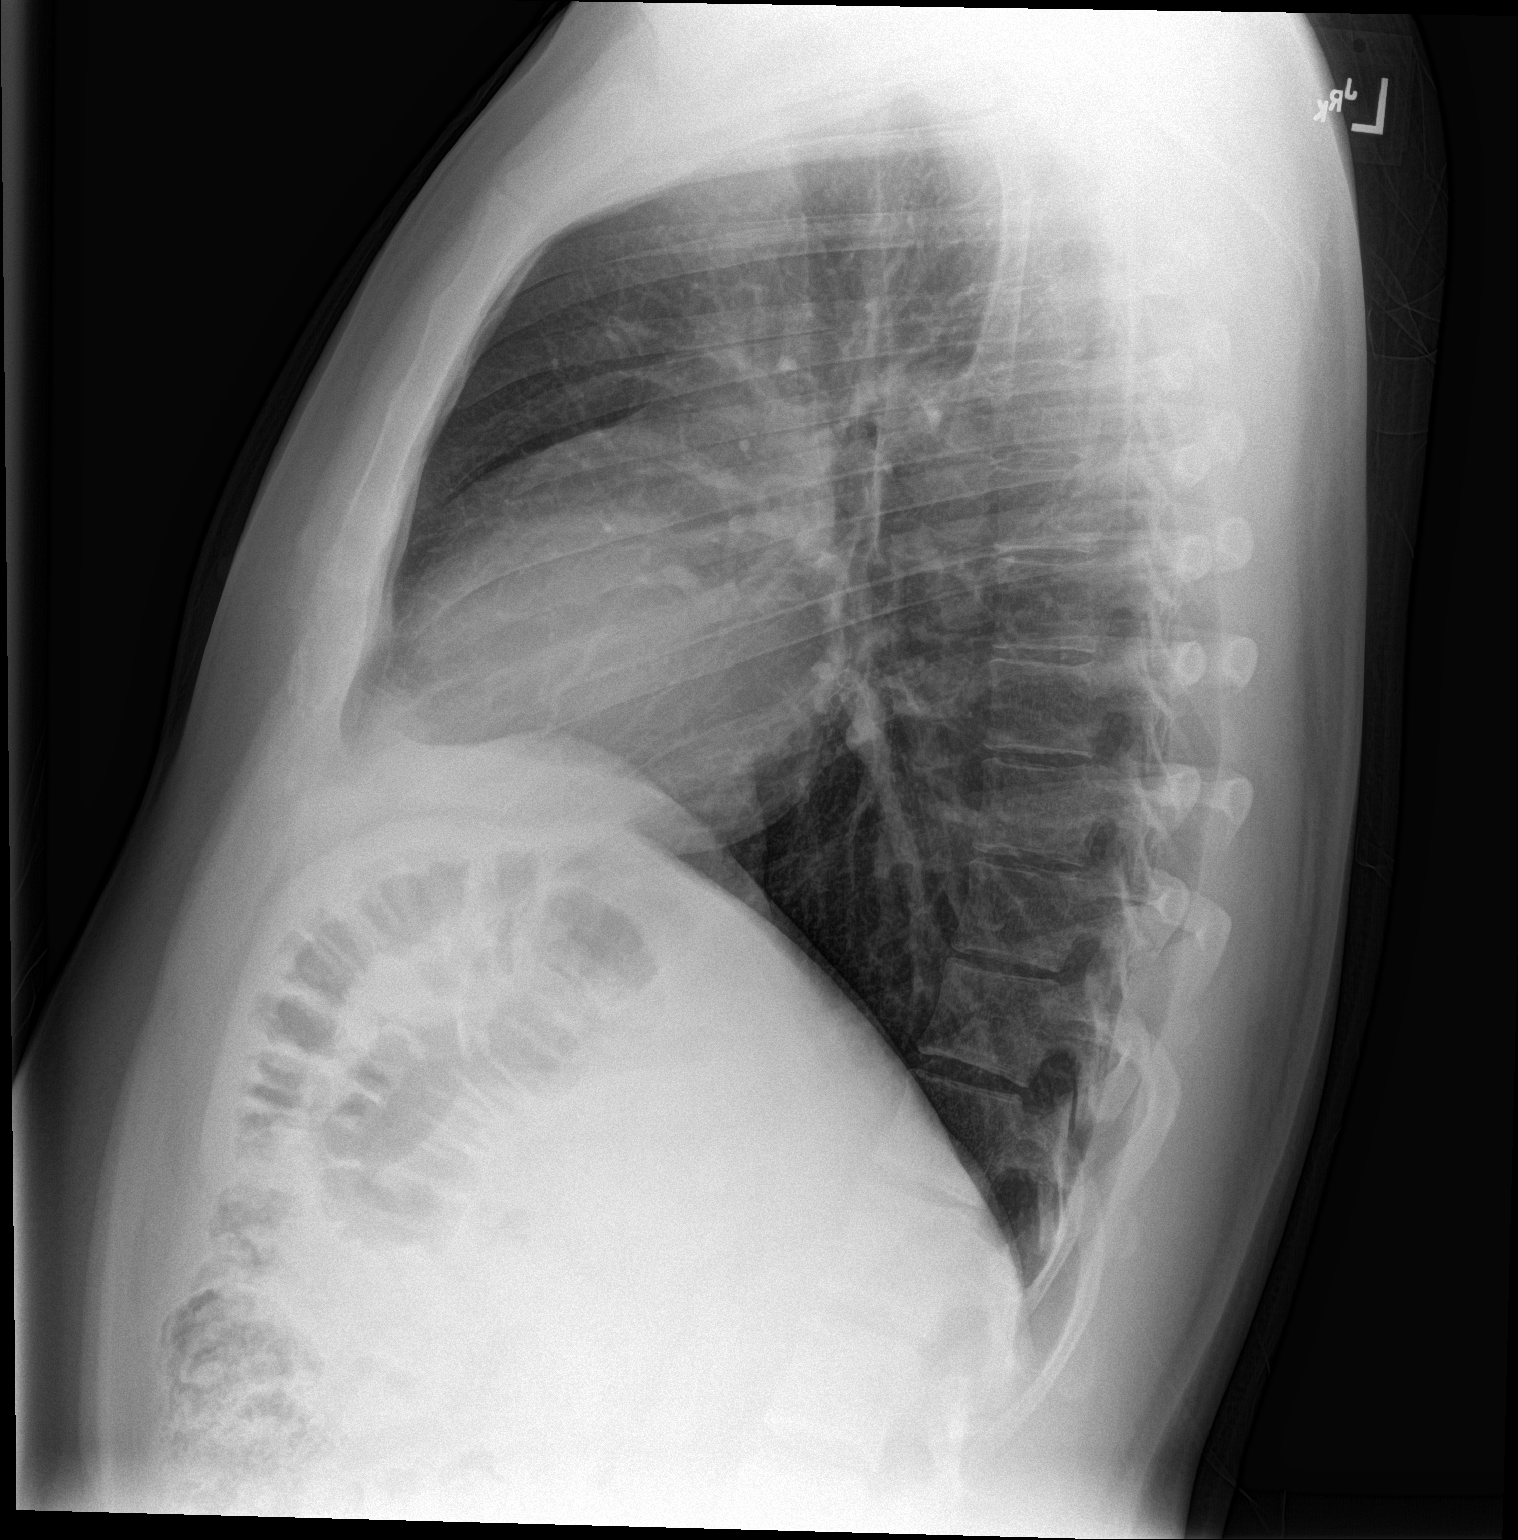

[2 of 2 positions shown; findings below may reference images not displayed]

FINDINGS: The heart size and mediastinal contours are within normal limits.
Both lungs are clear. The visualized skeletal structures are
unremarkable.
IMPRESSION: No active cardiopulmonary disease.

## 2018-09-23 ENCOUNTER — Other Ambulatory Visit: Payer: Self-pay

## 2018-09-23 ENCOUNTER — Ambulatory Visit
Admission: EM | Admit: 2018-09-23 | Discharge: 2018-09-23 | Disposition: A | Discharge status: Home / Self Care | Payer: BLUE CROSS/BLUE SHIELD | Attending: Family Medicine | Admitting: Family Medicine

## 2018-09-23 ENCOUNTER — Encounter: Payer: Self-pay | Admitting: Emergency Medicine

## 2018-09-23 DIAGNOSIS — E0842 Diabetes mellitus due to underlying condition with diabetic polyneuropathy: Secondary | ICD-10-CM | POA: Diagnosis not present

## 2018-09-23 NOTE — Discharge Instructions (Signed)
Better control of blood sugars Recommend contact and/or follow up with Primary Care provider

## 2018-09-23 NOTE — ED Triage Notes (Signed)
Patient states he is having numbness and tingling in his leg.  Patient states he has had this before but unsure what is causing it.

## 2018-09-23 NOTE — ED Provider Notes (Signed)
[facility]    CSN: [id number] Arrival date & time: 09/23/18  4098     History   Chief Complaint Chief Complaint  Patient presents with  . Dizziness    HPI William Kidd is a 33 y.o. male.   33 yo male with a c/o tingling sensations on both his legs and arms intermittently for months. Denies any one-sided weakness, vision changes, slurred speech, difficulty swallowing. Patient was seen about 2 months ago at [facility] ED with dizziness. Labs and EKG were normal except for blood sugar. Previous A1C labs demonstrate uncontrolled diabetes.   The history is provided by the patient.  Dizziness    Past Medical History:  Diagnosis Date  . Asthma   . Diabetes mellitus without complication (HCC)   . High cholesterol     There are no active problems to display for this patient.   History reviewed. No pertinent surgical history.     Home Medications    Prior to Admission medications   Medication Sig Start Date End Date Taking? Authorizing Provider  fenofibrate 54 MG tablet Take 54 mg by mouth daily.   Yes [provider]  lisinopril (PRINIVIL,ZESTRIL) 5 MG tablet Take 5 mg by mouth daily.   Yes [provider]  metFORMIN (GLUCOPHAGE) 500 MG tablet Take by mouth 2 (two) times daily with a meal.   Yes [provider]  naproxen (NAPROSYN) 500 MG tablet Take 1 tablet (500 mg total) by mouth 2 (two) times daily with a meal. 04/03/15  Yes Sharman Cheek, MD  ondansetron (ZOFRAN ODT) 8 MG disintegrating tablet Take 1 tablet (8 mg total) by mouth every 8 (eight) hours as needed for nausea or vomiting. 04/03/15   Sharman Cheek, MD  oxyCODONE-acetaminophen (ROXICET) 5-325 MG tablet Take 1 tablet by mouth every 6 (six) hours as needed for severe pain. 04/03/15   Sharman Cheek, MD    Family History Family History  Problem Relation Age of Onset  . Diabetes Mother     Social History Social History   Tobacco Use  . Smoking  status: Never Smoker  . Smokeless tobacco: Never Used  Substance Use Topics  . Alcohol use: No  . Drug use: No     Allergies   Patient has no known allergies.   Review of Systems Review of Systems  Neurological: Positive for dizziness.     Physical Exam Triage Vital Signs ED Triage Vitals  Enc Vitals Group     BP 09/23/18 1002 135/89     Pulse Rate 09/23/18 1002 79     Resp 09/23/18 1002 18     Temp 09/23/18 1002 98.4 F (36.9 C)     Temp Source 09/23/18 1002 Oral     SpO2 09/23/18 1002 100 %     Weight 09/23/18 0958 179 lb (81.2 kg)     Height 09/23/18 0958 5' (1.524 m)     Head Circumference --      Peak Flow --      Pain Score 09/23/18 0958 0     Pain Loc --      Pain Edu? --      Excl. in GC? --    No data found.  Updated Vital Signs BP 135/89 (BP Location: Left Arm)   Pulse 79   Temp 98.4 F (36.9 C) (Oral)   Resp 18   Ht 5' (1.524 m)   Wt 81.2 kg   SpO2 100%   BMI 34.96 kg/m  Visual Acuity Right Eye Distance:   Left Eye Distance:   Bilateral Distance:    Right Eye Near:   Left Eye Near:    Bilateral Near:     Physical Exam Vitals signs and nursing note reviewed.  Constitutional:      General: He is not in acute distress.    Appearance: Normal appearance. He is not ill-appearing, toxic-appearing or diaphoretic.  Skin:    Findings: No rash.  Neurological:     General: No focal deficit present.     Mental Status: He is alert and oriented to person, place, and time.      UC Treatments / Results  Labs (all labs ordered are listed, but only abnormal results are displayed) Labs Reviewed - No data to display  EKG None  Radiology No results found.  Procedures Procedures (including critical care time)  Medications Ordered in UC Medications - No data to display  Initial Impression / Assessment and Plan / UC Course  I have reviewed the triage vital signs and the nursing notes.  Pertinent labs & imaging results that were  available during my care of the patient were reviewed by me and considered in my medical decision making (see chart for details).      Final Clinical Impressions(s) / UC Diagnoses   Final diagnoses:  Diabetic polyneuropathy associated with diabetes mellitus due to underlying condition [facility])     Discharge Instructions     Better control of blood sugars Recommend contact and/or follow up with Primary Care provider    ED Prescriptions    None     1. diagnosis reviewed with patient 2. Recommend improved blood sugar control and follow up with PCP for further management  Controlled Substance Prescriptions [place] Controlled Substance Registry consulted? Not Applicable   Payton Mccallum, MD 09/23/18 [phone]

## 2024-04-21 ENCOUNTER — Emergency Department
Admission: EM | Admit: 2024-04-21 | Discharge: 2024-04-21 | Disposition: A | Discharge status: Home / Self Care | Payer: Self-pay | Attending: Emergency Medicine | Admitting: Emergency Medicine

## 2024-04-21 ENCOUNTER — Emergency Department: Payer: Self-pay

## 2024-04-21 ENCOUNTER — Other Ambulatory Visit: Payer: Self-pay

## 2024-04-21 DIAGNOSIS — N201 Calculus of ureter: Secondary | ICD-10-CM

## 2024-04-21 DIAGNOSIS — R739 Hyperglycemia, unspecified: Secondary | ICD-10-CM | POA: Insufficient documentation

## 2024-04-21 DIAGNOSIS — N132 Hydronephrosis with renal and ureteral calculous obstruction: Secondary | ICD-10-CM | POA: Insufficient documentation

## 2024-04-21 DIAGNOSIS — R000 Tachycardia, unspecified: Secondary | ICD-10-CM | POA: Insufficient documentation

## 2024-04-21 DIAGNOSIS — N23 Unspecified renal colic: Secondary | ICD-10-CM

## 2024-04-21 LAB — CBC WITH DIFFERENTIAL/PLATELET
Abs Immature Granulocytes: 0.04 K/uL (ref 0.00–0.07)
Basophils Absolute: 0 K/uL (ref 0.0–0.1)
Basophils Relative: 0 %
Eosinophils Absolute: 0.2 K/uL (ref 0.0–0.5)
Eosinophils Relative: 2 %
HCT: 49 % (ref 39.0–52.0)
Hemoglobin: 16.3 g/dL (ref 13.0–17.0)
Immature Granulocytes: 0 %
Lymphocytes Relative: 29 %
Lymphs Abs: 3 K/uL (ref 0.7–4.0)
MCH: 27.3 pg (ref 26.0–34.0)
MCHC: 33.3 g/dL (ref 30.0–36.0)
MCV: 82.2 fL (ref 80.0–100.0)
Monocytes Absolute: 0.9 K/uL (ref 0.1–1.0)
Monocytes Relative: 8 %
Neutro Abs: 6.3 K/uL (ref 1.7–7.7)
Neutrophils Relative %: 61 %
Platelets: 220 K/uL (ref 150–400)
RBC: 5.96 MIL/uL — ABNORMAL HIGH (ref 4.22–5.81)
RDW: 12.1 % (ref 11.5–15.5)
WBC: 10.4 K/uL (ref 4.0–10.5)
nRBC: 0 % (ref 0.0–0.2)

## 2024-04-21 LAB — COMPREHENSIVE METABOLIC PANEL WITH GFR
ALT: 29 U/L (ref 0–44)
AST: 23 U/L (ref 15–41)
Albumin: 4.4 g/dL (ref 3.5–5.0)
Alkaline Phosphatase: 104 U/L (ref 38–126)
Anion gap: 13 (ref 5–15)
BUN: 20 mg/dL (ref 6–20)
CO2: 23 mmol/L (ref 22–32)
Calcium: 9.1 mg/dL (ref 8.9–10.3)
Chloride: 98 mmol/L (ref 98–111)
Creatinine, Ser: 0.97 mg/dL (ref 0.61–1.24)
GFR, Estimated: >60 mL/min (ref >60)
Glucose, Bld: 340 mg/dL — ABNORMAL HIGH (ref 70–99)
Potassium: 4.1 mmol/L (ref 3.5–5.1)
Sodium: 134 mmol/L — ABNORMAL LOW (ref 135–145)
Total Bilirubin: 0.8 mg/dL (ref 0.0–1.2)
Total Protein: 8.1 g/dL (ref 6.5–8.1)

## 2024-04-21 LAB — CBG MONITORING, ED: Glucose-Capillary: 321 mg/dL — ABNORMAL HIGH (ref 70–99)

## 2024-04-21 LAB — URINALYSIS, ROUTINE W REFLEX MICROSCOPIC
Bacteria, UA: NONE SEEN
Bilirubin Urine: NEGATIVE
Glucose, UA: >=500 mg/dL — AB
Ketones, ur: 5 mg/dL — AB
Leukocytes,Ua: NEGATIVE
Nitrite: NEGATIVE
Protein, ur: NEGATIVE mg/dL
Specific Gravity, Urine: 1.031 — ABNORMAL HIGH (ref 1.005–1.030)
pH: 5 (ref 5.0–8.0)

## 2024-04-21 MED ORDER — OXYCODONE HCL 5 MG PO TABS: 5.0000 mg | ORAL_TABLET | Freq: Three times a day (TID) | ORAL | 0 refills | Status: AC | PRN

## 2024-04-21 MED ORDER — TAMSULOSIN HCL 0.4 MG PO CAPS
0.4000 mg | ORAL_CAPSULE | Freq: Once | ORAL | Status: AC
  Administered 2024-04-21: 0.4 mg via ORAL
  Filled 2024-04-21: qty 1

## 2024-04-21 MED ORDER — OXYCODONE HCL 5 MG PO TABS
5.0000 mg | ORAL_TABLET | Freq: Once | ORAL | Status: AC
Start: 2024-04-21 — End: 2024-04-21
  Administered 2024-04-21: 5 mg via ORAL
  Filled 2024-04-21: qty 1

## 2024-04-21 MED ORDER — KETOROLAC TROMETHAMINE 30 MG/ML IJ SOLN
30.0000 mg | Freq: Once | INTRAMUSCULAR | Status: AC
  Administered 2024-04-21: 30 mg via INTRAMUSCULAR
  Filled 2024-04-21: qty 1

## 2024-04-21 MED ORDER — ACETAMINOPHEN 500 MG PO TABS
1000.0000 mg | ORAL_TABLET | Freq: Once | ORAL | Status: AC
Start: 2024-04-21 — End: 2024-04-21
  Administered 2024-04-21: 1000 mg via ORAL
  Filled 2024-04-21: qty 2

## 2024-04-21 MED ORDER — TAMSULOSIN HCL 0.4 MG PO CAPS: 0.4000 mg | ORAL_CAPSULE | Freq: Every day | ORAL | 0 refills | Status: AC

## 2024-04-21 NOTE — ED Triage Notes (Signed)
 Pt reports left flank pain that began pt was seen at an ER in [place]  earlier today and dx with kidney stone, pt has rx for Vicodin, Toradol  and zofran . Pt reports it has not helped with pain.

## 2024-04-21 NOTE — Discharge Instructions (Addendum)
 Take acetaminophen  650 mg and ibuprofen 400 mg every 6 hours for pain.  Take with food. Take Flomax to the as prescribed. Take oxycodone  5 mg as prescribed, only as needed for severe pain.  Make sure you do not take any other narcotic medications while taking this medication. Use the strainer when you urinate to try to catch the stone, and place it in the specimen cup to bring to your urology appointment as they may be able to do extra testing on the stone.  Your kidney stone was approximately 3 mm, which is on the smaller side and is highly likely to pass on its own without any intervention.  In any case, call Dr. Francisca who is a urologist and kidney stone specialist for follow-up appointment.  Thank you for choosing us  for your health care today!  Please see your primary doctor this week for a follow up appointment.   If you have any new, worsening, or unexpected symptoms call your doctor right away or come back to the emergency department for reevaluation.  It was my pleasure to care for you today.   Ginnie EDISON Cyrena, MD

## 2024-04-21 NOTE — ED Provider Notes (Signed)
 [facility] Provider Note    Event Date/Time   First MD Initiated Contact with Patient 04/21/24 0408     (approximate)   History   Flank Pain   HPI  William Kidd is a 38 y.o. male   Past medical history of recurrent kidney stones who presents to the emergency department with acute onset left-sided flank pain radiating to the groin yesterday.  Was seen at hospital in [place]  and diagnosed with a kidney stone and discharged.  Today the pain got quite bad.  Denies any fevers, dysuria or frequency.  No testicular swelling or pain currently.  Independent Historian contributed to assessment above: Further corroborates information above  External Medical Documents Reviewed: Previous hospital notes with ureterolithiasis diagnosed      Physical Exam   Triage Vital Signs: ED Triage Vitals  Encounter Vitals Group     BP 04/21/24 0138 (!) 162/108     Girls Systolic BP Percentile --      Girls Diastolic BP Percentile --      Boys Systolic BP Percentile --      Boys Diastolic BP Percentile --      Pulse Rate 04/21/24 0138 (!) 103     Resp 04/21/24 0138 19     Temp 04/21/24 0138 98.7 F (37.1 C)     Temp src --      SpO2 04/21/24 0138 100 %     Weight 04/21/24 0137 168 lb (76.2 kg)     Height 04/21/24 0137 5' 6 (1.676 m)     Head Circumference --      Peak Flow --      Pain Score 04/21/24 0137 7     Pain Loc --      Pain Education --      Exclude from Growth Chart --     Most recent vital signs: Vitals:   04/21/24 0138  BP: (!) 162/108  Pulse: (!) 103  Resp: 19  Temp: 98.7 F (37.1 C)  SpO2: 100%    General: Awake, no distress.  CV:  Good peripheral perfusion.  Resp:  Normal effort.  Abd:  No distention.  Other:  Mild left-sided CVA tenderness, hypertensive and tachycardic.  Soft benign abdominal exam.  Nontoxic appearance, no fever.   ED Results / Procedures / Treatments   Labs (all labs ordered are listed, but only  abnormal results are displayed) Labs Reviewed  CBC WITH DIFFERENTIAL/PLATELET - Abnormal; Notable for the following components:      Result Value   RBC 5.96 (*)    All other components within normal limits  URINALYSIS, ROUTINE W REFLEX MICROSCOPIC - Abnormal; Notable for the following components:   Color, Urine YELLOW (*)    APPearance CLEAR (*)    Specific Gravity, Urine 1.031 (*)    Glucose, UA >=500 (*)    Hgb urine dipstick MODERATE (*)    Ketones, ur 5 (*)    All other components within normal limits  COMPREHENSIVE METABOLIC PANEL WITH GFR - Abnormal; Notable for the following components:   Sodium 134 (*)    Glucose, Bld 340 (*)    All other components within normal limits  CBG MONITORING, ED - Abnormal; Notable for the following components:   Glucose-Capillary 321 (*)    All other components within normal limits     I ordered and reviewed the above labs they are notable for glucose is high, no signs of DKA.  No signs of infection  on urinalysis.    RADIOLOGY I independently reviewed and interpreted CT of the abdomen and see multiple kidney stones and a left-sided UVJ stone I also reviewed radiologist's formal read.   PROCEDURES:  Critical Care performed: No  Procedures   MEDICATIONS ORDERED IN ED: Medications  ketorolac  (TORADOL ) 30 MG/ML injection 30 mg (30 mg Intramuscular Given 04/21/24 0458)  acetaminophen  (TYLENOL ) tablet 1,000 mg (1,000 mg Oral Given 04/21/24 0458)  oxyCODONE  (Oxy IR/ROXICODONE ) immediate release tablet 5 mg (5 mg Oral Given 04/21/24 0458)  tamsulosin (FLOMAX) capsule 0.4 mg (0.4 mg Oral Given 04/21/24 0458)     IMPRESSION / MDM / ASSESSMENT AND PLAN / ED COURSE  I reviewed the triage vital signs and the nursing notes.                                Patient's presentation is most consistent with acute presentation with potential threat to life or bodily function.  Differential diagnosis includes, but is not limited to, ureterolithiasis,  obstructive uropathy, urinary tract infection   The patient is on the cardiac monitor to evaluate for evidence of arrhythmia and/or significant heart rate changes.  MDM:    History of recurrent kidney stones here with left-sided flank pain with a left sided 3 mm UVJ stone with no concomitant urinary tract infection and is otherwise healthy young man.  Pain moderately well-controlled.  Discharged with Flomax, pain medications and urology follow-up.  Given a strainer.  I considered hospitalization for admission or observation however given decent pain control, no evidence of urine infection, and a 3 mm stone, I think outpatient management and outpatient follow-up with urology is appropriate at this time.        FINAL CLINICAL IMPRESSION(S) / ED DIAGNOSES   Final diagnoses:  Ureteral colic  Ureterolithiasis     Rx / DC Orders   ED Discharge Orders          Ordered    tamsulosin (FLOMAX) 0.4 MG CAPS capsule  Daily        04/21/24 0515    oxyCODONE  (ROXICODONE ) 5 MG immediate release tablet  Every 8 hours PRN        04/21/24 0515             Note:  This document was prepared using Dragon voice recognition software and may include unintentional dictation errors.    Cyrena Mylar, MD 04/21/24 [phone]

## 2024-04-21 NOTE — ED Notes (Signed)
 Patient provided with urine strainer and instructed on use. Pt verbalized understanding.
